# Patient Record
Sex: Female | Born: 1950 | Race: White | Marital: Married | Smoking: Former smoker
Health system: Southern US, Community
[De-identification: ages and names within clinical notes are randomized; demographics above are authoritative.]

---

## 2021-10-27 ENCOUNTER — Encounter: Payer: Self-pay | Admitting: Pulmonary Disease

## 2021-10-27 ENCOUNTER — Other Ambulatory Visit: Payer: Self-pay

## 2021-10-27 ENCOUNTER — Ambulatory Visit (INDEPENDENT_AMBULATORY_CARE_PROVIDER_SITE_OTHER): Payer: Medicare Other

## 2021-10-27 ENCOUNTER — Other Ambulatory Visit (INDEPENDENT_AMBULATORY_CARE_PROVIDER_SITE_OTHER): Payer: Medicare Other

## 2021-10-27 ENCOUNTER — Ambulatory Visit: Payer: Medicare Other | Admitting: Pulmonary Disease

## 2021-10-27 VITALS — BP 126/80 | HR 72 | Temp 98.8°F | Ht 64.5 in | Wt 205.4 lb

## 2021-10-27 DIAGNOSIS — R0600 Dyspnea, unspecified: Secondary | ICD-10-CM

## 2021-10-27 LAB — CBC WITH DIFFERENTIAL/PLATELET
Basophils Absolute: 0 10*3/uL (ref 0.0–0.1)
Basophils Relative: 0.6 % (ref 0.0–3.0)
Eosinophils Absolute: 0.1 10*3/uL (ref 0.0–0.7)
Eosinophils Relative: 1.4 % (ref 0.0–5.0)
HCT: 41.9 % (ref 36.0–46.0)
Hemoglobin: 13.7 g/dL (ref 12.0–15.0)
Lymphocytes Relative: 25.2 % (ref 12.0–46.0)
Lymphs Abs: 2.2 10*3/uL (ref 0.7–4.0)
MCHC: 32.8 g/dL (ref 30.0–36.0)
MCV: 89.8 fl (ref 78.0–100.0)
Monocytes Absolute: 0.7 10*3/uL (ref 0.1–1.0)
Monocytes Relative: 8.2 % (ref 3.0–12.0)
Neutro Abs: 5.6 10*3/uL (ref 1.4–7.7)
Neutrophils Relative %: 64.6 % (ref 43.0–77.0)
Platelets: 262 10*3/uL (ref 150.0–400.0)
RBC: 4.66 Mil/uL (ref 3.87–5.11)
RDW: 13.7 % (ref 11.5–15.5)
WBC: 8.7 10*3/uL (ref 4.0–10.5)

## 2021-10-27 MED ORDER — BREZTRI AEROSPHERE 160-9-4.8 MCG/ACT IN AERO: 2.0000 | INHALATION_SPRAY | Freq: Two times a day (BID) | RESPIRATORY_TRACT | 5 refills | Status: DC

## 2021-10-27 NOTE — Patient Instructions (Signed)
We will start you on a inhaler called Trelegy or bevespi based on insurance coverage Check CBC differential and IgE Get chest x-ray today and schedule PFTs Work on weight loss with diet and exercise Follow-up in 2 to 3 months

## 2021-10-27 NOTE — Progress Notes (Signed)
Caitlyn Hunter    [id number]    [birth year]  Primary Care Physician:No primary care provider on file.  Referring Physician: No referring provider defined for this encounter.  Chief complaint: Consult for dyspnea, asthma  HPI: 71 year old with history of asthma, allergies, hyperlipidemia, chronic headaches Referred for evaluation of dyspnea associated with cough, wheezing.  She has chest congestion with white mucus production Maintained on albuterol.  She has history of allergies to multiple allergens and was on allergy shots until 2013.  History of sinus surgery in 2000 for chronic sinusitis.  Using steroid nasal spray and antihistamine for postnasal drip and PPI for GERD  Pets: Dogs, horses, cats Occupation: Retired Metallurgist Exposures: No mold, hot tub, Jacuzzi.  No feather pillows or comforters Smoking history: 40-pack-year smoker.  Quit in 2010 Travel history: From [place].  No significant travel Relevant family history: No family history of lung disease   Outpatient Encounter Medications as of 10/27/2021  Medication Sig   albuterol (VENTOLIN HFA) 108 (90 Base) MCG/ACT inhaler SMARTSIG:1 Puff(s) Via Inhaler Every 4 Hours PRN   atorvastatin (LIPITOR) 10 MG tablet Take 10 mg by mouth daily.   escitalopram (LEXAPRO) 10 MG tablet Take 10 mg by mouth daily.   esomeprazole (NEXIUM) 40 MG capsule Take by mouth.   fluticasone (FLONASE) 50 MCG/ACT nasal spray Place into the nose.   loratadine (CLARITIN) 10 MG tablet Take by mouth.   meloxicam (MOBIC) 15 MG tablet Take 15 mg by mouth daily.   montelukast (SINGULAIR) 10 MG tablet Take 10 mg by mouth daily.   No facility-administered encounter medications on file as of 10/27/2021.    Allergies as of 10/27/2021 - Review Complete 10/27/2021  Allergen Reaction Noted   Codeine Rash 07/11/2018    No past medical history on file.  History reviewed. No pertinent surgical history.  No family history on file.  Social  History   Socioeconomic History   Marital status: Married    Spouse name: Not on file   Number of children: Not on file   Years of education: Not on file   Highest education level: Not on file  Occupational History   Not on file  Tobacco Use   Smoking status: Former    Types: Cigarettes    Quit date: 10/04/1999    Years since quitting: 22.0   Smokeless tobacco: Never  Substance and Sexual Activity   Alcohol use: Not on file   Drug use: Not on file   Sexual activity: Not on file  Other Topics Concern   Not on file  Social History Narrative   Not on file   Social Determinants of Health   Financial Resource Strain: Not on file  Food Insecurity: Not on file  Transportation Needs: Not on file  Physical Activity: Not on file  Stress: Not on file  Social Connections: Not on file  Intimate Partner Violence: Not on file    Review of systems: Review of Systems  Constitutional: Negative for fever and chills.  HENT: Negative.   Eyes: Negative for blurred vision.  Respiratory: as per HPI  Cardiovascular: Negative for chest pain and palpitations.  Gastrointestinal: Negative for vomiting, diarrhea, blood per rectum. Genitourinary: Negative for dysuria, urgency, frequency and hematuria.  Musculoskeletal: Negative for myalgias, back pain and joint pain.  Skin: Negative for itching and rash.  Neurological: Negative for dizziness, tremors, focal weakness, seizures and loss of consciousness.  Endo/Heme/Allergies: Negative for environmental allergies.  Psychiatric/Behavioral: Negative  for depression, suicidal ideas and hallucinations.  All other systems reviewed and are negative.  Physical Exam: Blood pressure 126/80, pulse 72, temperature 98.8 F (37.1 C), temperature source Oral, height 5' 4.5" (1.638 m), weight 205 lb 6.4 oz (93.2 kg), SpO2 97 %. Gen:      No acute distress HEENT:  EOMI, sclera anicteric Neck:     No masses; no thyromegaly Lungs:    Clear to auscultation  bilaterally; normal respiratory effort CV:         Regular rate and rhythm; no murmurs Abd:      + bowel sounds; soft, non-tender; no palpable masses, no distension Ext:    No edema; adequate peripheral perfusion Skin:      Warm and dry; no rash Neuro: alert and oriented x 3 Psych: normal mood and affect  Data Reviewed: Imaging:   PFTs:  Labs:  Assessment:  Evaluation for dyspnea May have asthma and COPD based on symptoms, smoking history and history of significant allergies Start breztri inhaler Check CBC differential, IgE Chest x-ray and PFTs  Work on weight loss with diet and exercise  Plan/Recommendations: Breztri CBC, IgE Chest x-ray and PFTs Weight loss  Marshell Garfinkel MD [facility] 10/27/2021, 2:18 PM  CC: No ref. provider found

## 2021-10-28 LAB — IGE: IgE (Immunoglobulin E), Serum: 76 kU/L (ref <=114)

## 2021-12-31 ENCOUNTER — Ambulatory Visit (INDEPENDENT_AMBULATORY_CARE_PROVIDER_SITE_OTHER): Payer: Medicare Other | Admitting: Pulmonary Disease

## 2021-12-31 DIAGNOSIS — R0600 Dyspnea, unspecified: Secondary | ICD-10-CM

## 2021-12-31 LAB — PULMONARY FUNCTION TEST
DL/VA % pred: 106 %
DL/VA: 4.39 ml/min/mmHg/L
DLCO cor % pred: 109 %
DLCO cor: 21.36 ml/min/mmHg
DLCO unc % pred: 109 %
DLCO unc: 21.36 ml/min/mmHg
FEF 25-75 Post: 2.88 L/sec
FEF 25-75 Pre: 1.65 L/sec
FEF2575-%Change-Post: 74 %
FEF2575-%Pred-Post: 148 %
FEF2575-%Pred-Pre: 84 %
FEV1-%Change-Post: 12 %
FEV1-%Pred-Post: 103 %
FEV1-%Pred-Pre: 92 %
FEV1-Post: 2.38 L
FEV1-Pre: 2.12 L
FEV1FVC-%Change-Post: 5 %
FEV1FVC-%Pred-Pre: 99 %
FEV6-%Change-Post: 6 %
FEV6-%Pred-Post: 102 %
FEV6-%Pred-Pre: 96 %
FEV6-Post: 2.98 L
FEV6-Pre: 2.8 L
FEV6FVC-%Pred-Post: 104 %
FEV6FVC-%Pred-Pre: 104 %
FVC-%Change-Post: 7 %
FVC-%Pred-Post: 98 %
FVC-%Pred-Pre: 92 %
FVC-Post: 3 L
FVC-Pre: 2.8 L
Post FEV1/FVC ratio: 80 %
Post FEV6/FVC ratio: 100 %
Pre FEV1/FVC ratio: 76 %
Pre FEV6/FVC Ratio: 100 %
RV % pred: 116 %
RV: 2.52 L
TLC % pred: 111 %
TLC: 5.63 L

## 2021-12-31 NOTE — Patient Instructions (Signed)
Full PFT performed today. °

## 2021-12-31 NOTE — Progress Notes (Signed)
Full PFT performed today. °

## 2022-01-19 ENCOUNTER — Ambulatory Visit: Payer: Medicare Other | Admitting: Pulmonary Disease

## 2022-01-19 ENCOUNTER — Encounter: Payer: Self-pay | Admitting: Pulmonary Disease

## 2022-01-19 VITALS — BP 126/68 | HR 66 | Temp 98.2°F | Ht 64.0 in | Wt 191.8 lb

## 2022-01-19 DIAGNOSIS — R0600 Dyspnea, unspecified: Secondary | ICD-10-CM

## 2022-01-19 NOTE — Progress Notes (Signed)
? ?      ?Caitlyn Hunter    [id number]    Aug 14, 1951 ? ?Primary Care Physician:Vasireddy, Audery Amel, MD ? ?Referring Physician: No referring provider defined for this encounter. ? ?Chief complaint: Follow-up for dyspnea, asthma ? ?HPI: ?71 year old with history of asthma, allergies, hyperlipidemia, chronic headaches ?Referred for evaluation of dyspnea associated with cough, wheezing.  She has chest congestion with white mucus production ?Maintained on albuterol. ? ?She has history of allergies to multiple allergens and was on allergy shots until 2013.  History of sinus surgery in 2000 for chronic sinusitis. ? ?Using steroid nasal spray and antihistamine for postnasal drip and PPI for GERD ? ?Pets: Dogs, horses, cats ?Occupation: Retired Metallurgist ?Exposures: No mold, hot tub, Jacuzzi.  No feather pillows or comforters ?Smoking history: 40-pack-year smoker.  Quit in 2010 ?Travel history: From [place].  No significant travel ?Relevant family history: No family history of lung disease ? ?Interim history: ?He was given breztri at last visit.  She tried it for a few weeks but did not note any change and stopped using it ?She is losing weight with a weight loss program and notes improvement in breathing ? ?Outpatient Encounter Medications as of 01/19/2022  ?Medication Sig  ? atorvastatin (LIPITOR) 10 MG tablet Take 10 mg by mouth daily.  ? escitalopram (LEXAPRO) 10 MG tablet Take 10 mg by mouth daily.  ? esomeprazole (NEXIUM) 40 MG capsule Take by mouth.  ? fluticasone (FLONASE) 50 MCG/ACT nasal spray Place into the nose.  ? loratadine (CLARITIN) 10 MG tablet Take by mouth.  ? meloxicam (MOBIC) 15 MG tablet Take 15 mg by mouth daily.  ? montelukast (SINGULAIR) 10 MG tablet Take 10 mg by mouth daily.  ? albuterol (VENTOLIN HFA) 108 (90 Base) MCG/ACT inhaler SMARTSIG:1 Puff(s) Via Inhaler Every 4 Hours PRN  ? Budeson-Glycopyrrol-Formoterol (BREZTRI AEROSPHERE) 160-9-4.8 MCG/ACT AERO Inhale 2 puffs into the lungs in the  morning and at bedtime.  ? ?No facility-administered encounter medications on file as of 01/19/2022.  ? ? ?Physical Exam: ?Blood pressure 126/80, pulse 72, temperature 98.8 ?F (37.1 ?C), temperature source Oral, height 5' 4.5" (1.638 m), weight 205 lb 6.4 oz (93.2 kg), SpO2 97 %. ?Gen:      No acute distress ?HEENT:  EOMI, sclera anicteric ?Neck:     No masses; no thyromegaly ?Lungs:    Clear to auscultation bilaterally; normal respiratory effort ?CV:         Regular rate and rhythm; no murmurs ?Abd:      + bowel sounds; soft, non-tender; no palpable masses, no distension ?Ext:    No edema; adequate peripheral perfusion ?Skin:      Warm and dry; no rash ?Neuro: alert and oriented x 3 ?Psych: normal mood and affect ? ?Data Reviewed: ?Imaging: ?Chest x-ray 10/27/2021-no active cardiopulmonary disease.  I have reviewed the images personally. ? ?PFTs: ?12/31/2021 ?FVC 3.30 [98%], FEV1 2.38 [103%], F/F 80, TLC 5.63 [101%], DLCO 21.36 [99%] ?Minimal obstructive airway disease as shown by curvature to flow loops.  Bronchodilator response. ? ?Labs: ?CBC 10/27/2021- WBC 8.7, eos 1.4%, absolute eosinophil count 122 ?IgE 10/27/2021- 76 ? ?Assessment:  ?Evaluation for dyspnea ?Has minimal reactive airway disease with bronchodilator response ?She has not noted any change with breztri inhaler and will discontinue it ? ?Overall breathing is improving as she loses weight.  There is no need for any controller medication at present ?She would like to follow-up as needed. ? ?Plan/Recommendations: ?Follow-up as needed ? ?Marshell Garfinkel MD ?[facility] ?01/19/2022, 4:08 PM ? ?CC: No ref. provider found ? ?  ?

## 2022-01-19 NOTE — Patient Instructions (Signed)
Glad you are doing well with your breathing ?Continue to lose weight ?We will discontinue the breztri ?Follow-up as needed. ?

## 2022-01-21 ENCOUNTER — Encounter: Payer: Self-pay | Admitting: Pulmonary Disease

## 2022-03-04 ENCOUNTER — Telehealth: Payer: Self-pay | Admitting: Pulmonary Disease

## 2022-03-04 DIAGNOSIS — G4733 Obstructive sleep apnea (adult) (pediatric): Secondary | ICD-10-CM

## 2022-03-04 NOTE — Telephone Encounter (Signed)
Patient states Dr. Heron Nay wanted her to have a sleep study due to her being very tired. Refers to do a HST in [place]. Please advise if patient needs a sleep consult first   ATC patient, LMTCB

## 2022-03-08 NOTE — Telephone Encounter (Signed)
Dr. Isaiah Serge, Do you want to order a sleep study for this patient per request of Dr. Heron Nay and have him follow up with one of our sleep doctors?  Please advise.  Thank you.

## 2022-03-11 NOTE — Telephone Encounter (Signed)
Called and spoke with patient. Advised patient I would order the home sleep study and someone will be in contact with her on when to come get the equipment. 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   Home sleep study order has been placed. Patient stated that she will make an appointment with Dr. Isaiah Serge when she comes to get the equipment.   Nothing further needed.

## 2022-03-11 NOTE — Telephone Encounter (Signed)
You can order a home sleep study under my name and make an appointment to see me

## 2022-04-07 ENCOUNTER — Ambulatory Visit: Payer: Medicare Other

## 2022-04-07 DIAGNOSIS — G4733 Obstructive sleep apnea (adult) (pediatric): Secondary | ICD-10-CM | POA: Diagnosis not present

## 2022-04-12 DIAGNOSIS — G4733 Obstructive sleep apnea (adult) (pediatric): Secondary | ICD-10-CM | POA: Diagnosis not present

## 2022-04-21 ENCOUNTER — Telehealth: Payer: Self-pay | Admitting: Pulmonary Disease

## 2022-04-21 DIAGNOSIS — G4733 Obstructive sleep apnea (adult) (pediatric): Secondary | ICD-10-CM

## 2022-04-21 NOTE — Telephone Encounter (Signed)
Home sleep study 04/06/2022 Moderate sleep apnea with AHI 17.2, desats to 83%  Please let patient know that sleep study does confirm moderate sleep apnea Order CPAP 5-15 cm H20 and follow up with one of our sleep doctors.

## 2022-04-21 NOTE — Telephone Encounter (Signed)
Called and spoke with patient. She is requesting the results of her HST test on 07/06. Dr. Isaiah Serge, can you please advise? Thanks!

## 2022-04-22 NOTE — Telephone Encounter (Signed)
Called and spoke with patient. Patient verbalized understanding. Order has been placed for cpap.   Nothing further needed.

## 2022-05-19 ENCOUNTER — Telehealth: Payer: Self-pay | Admitting: Pulmonary Disease

## 2022-05-19 NOTE — Telephone Encounter (Signed)
Can hold using CPAP mask for now let skin irritation resolved.  Patient was supposed to follow-up with sleep provider per Dr. Shirlee More last note.  They do make CPAP liners that she can use underneath the mass to see if this will help.  But we can discuss this at the office visit Please set her up with a follow-up visit with sleep MD /APP to go over issues.   Please contact office for sooner follow up if symptoms do not improve or worsen or seek emergency care

## 2022-05-19 NOTE — Telephone Encounter (Signed)
Spoke with pt and scheduled her for sleep consult with Rhunette Croft on 02/24/22 where masks will dicussed. Nothing further needed at this time.

## 2022-05-19 NOTE — Telephone Encounter (Signed)
Primary Pulmonologist: PM  Last office visit and with whom: April 2023, PM What do we see them for (pulmonary problems): OSA Last OV assessment/plan:  Assessment:  Evaluation for dyspnea Has minimal reactive airway disease with bronchodilator response She has not noted any change with breztri inhaler and will discontinue it   Overall breathing is improving as she loses weight.  There is no need for any controller medication at present She would like to follow-up as needed.   Plan/Recommendations: Follow-up as needed  Was appointment offered to patient (explain)?  No appointment in office available this week    Reason for call: Pt states when she woke up this morning and removed C-Pap mask skin on face under where mask had been was red and burned and was still doing so at time of call. Pt had contacted DME who stated they did have another type of mask but it also had latex in it. Pt states she has the same reaction to bandages. Pt advise not use mask until situation was addressed. Tammy please advise as Dr. Isaiah Serge is unavailable. Thank you    (examples of things to ask: : When did symptoms start? Fever? Cough? Productive? Color to sputum? More sputum than usual? Wheezing? Have you needed increased oxygen? Are you taking your respiratory medications? What over the counter measures have you tried?)  Allergies  Allergen Reactions   Codeine Rash    Immunization History  Administered Date(s) Administered   Fluad Quad(high Dose 65+) 09/02/2021

## 2022-05-27 ENCOUNTER — Ambulatory Visit: Payer: Medicare Other | Admitting: Nurse Practitioner

## 2022-05-27 ENCOUNTER — Encounter: Payer: Self-pay | Admitting: Nurse Practitioner

## 2022-05-27 DIAGNOSIS — G4733 Obstructive sleep apnea (adult) (pediatric): Secondary | ICD-10-CM | POA: Diagnosis not present

## 2022-05-27 DIAGNOSIS — R0609 Other forms of dyspnea: Secondary | ICD-10-CM | POA: Diagnosis not present

## 2022-05-27 DIAGNOSIS — Z9989 Dependence on other enabling machines and devices: Secondary | ICD-10-CM

## 2022-05-27 NOTE — Patient Instructions (Addendum)
Continue to use CPAP every night, minimum of 4-6 hours a night.  Change equipment every 30 days or as directed by DME. Wash your tubing with warm soap and water daily, hang to dry. Wash humidifier portion weekly.  Be aware of reduced alertness and do not drive or operate heavy machinery if experiencing this or drowsiness.  Exercise encouraged, as tolerated. Avoid or decrease alcohol consumption and medications that make you more sleepy, if possible. Notify if persistent daytime sleepiness occurs even with consistent use of CPAP.   Continue singulair 10 mg At bedtime  Continue claritin 1 tab daily for allergies  Continue flonase 2 sprays each nostril daily   Follow up in 6 weeks with Katie Yazmine Sorey,NP. If symptoms do not improve or worsen, please contact office for sooner follow up or seek emergency care.

## 2022-05-27 NOTE — Progress Notes (Signed)
@Patient  ID: , female    DOB: 26-Jan-1951, 71 y.o.   MRN: [id number]  Chief Complaint  Patient presents with   Consult    Not able to tolerate CPAP mask. Some SOB with hills, exertion    Referring provider: [id number], MD  HPI: 71 year old female, former smoker followed for asthma and DOE. She is a patient of Dr. 78 and last seen in office 01/19/2022. She has minimal reactive airway disease with bronchodilator response; didn't have perceived benefit from [place]. Past medical history significant for allergies, HLD, chronic headaches.  TEST/EVENTS:  12/31/2021 PFT: FVC 98, FEV1 103, ratio 80, TLC 101, DLCO 99. +12% BD change 04/06/2022 HST: AHI 17.2, SpO2 low 83%   01/19/2022: OV with Dr. 01/21/2022. She was initially referred in January for evaluation of DOE and cough. PFTs showed normal lung function; minimal obstruction on flow volume loops and positive BD. History of asthma; tried on Breztri but no perceived benefit. Breathing improved with weight loss. Follow up PRN. Later, Dr. February contacted requesting sleep study for patient due to fatigue - HST ordered. Advised follow up with NP or sleep MD.  05/27/2022: Today - follow up Patient presents today for follow up after home sleep study which revealed moderate OSA and starting on CPAP therapy. She was doing well with CPAP initially and felt like she was sleeping better at night with it. Unfortunately, she developed irritation from the mask so she had to stop using it. She would really like to get back on CPAP, if she can tolerate it. She denies drowsy driving or morning headaches. She still has daily fatigue symptoms.  She goes to bed around 9pm. Takes her 1-2 hours to fall asleep. She has not tried anything for sleep assistance but is considering trying melatonin. She wakes frequently throughout the night; although, when she was on CPAP, she only had 1-2 awakenings. She officially gets out of the bed in the morning  around 7am. She's retired. Lives with her husband. No cardiac history, aside from HLD. No hx of stroke. She has been actively working on weight loss measures and is down almost 20 pounds since she was first seen here in January. She quit smoking 10 years ago; doesn't drink alcohol.  Her breathing is stable. She only ever gets winded if she's running uphill she said. Feels like her breathing continues to improve with her weight loss. Hasn't had to use albuterol.   [place] 4  Allergies  Allergen Reactions   Codeine Rash    Immunization History  Administered Date(s) Administered   Fluad Quad(high Dose 65+) 09/02/2021    History reviewed. No pertinent past medical history.  Tobacco History: Social History   Tobacco Use  Smoking Status Former   Types: Cigarettes   Quit date: 10/04/1999   Years since quitting: 22.6  Smokeless Tobacco Never   Counseling given: Not Answered   Outpatient Medications Prior to Visit  Medication Sig Dispense Refill   ascorbic acid (VITAMIN C) 1000 MG tablet Take 1,000 mg by mouth.     atorvastatin (LIPITOR) 10 MG tablet Take 10 mg by mouth daily.     Cholecalciferol 125 MCG (5000 UT) TABS Take by mouth.     escitalopram (LEXAPRO) 10 MG tablet Take 10 mg by mouth daily.     esomeprazole (NEXIUM) 40 MG capsule Take by mouth.     levocetirizine (XYZAL) 5 MG tablet Take 5 mg by mouth daily.     meloxicam (MOBIC) 15 MG  tablet Take 15 mg by mouth daily.     montelukast (SINGULAIR) 10 MG tablet Take 10 mg by mouth daily.     albuterol (VENTOLIN HFA) 108 (90 Base) MCG/ACT inhaler SMARTSIG:1 Puff(s) Via Inhaler Every 4 Hours PRN     Budeson-Glycopyrrol-Formoterol (BREZTRI AEROSPHERE) 160-9-4.8 MCG/ACT AERO Inhale 2 puffs into the lungs in the morning and at bedtime. 10.7 g 5   fluticasone (FLONASE) 50 MCG/ACT nasal spray Place into the nose.     loratadine (CLARITIN) 10 MG tablet Take by mouth.     No facility-administered medications prior to visit.      Review of Systems:   Constitutional: No weight loss or gain, night sweats, fevers, chills, or lassitude. +fatigue HEENT: No headaches, difficulty swallowing, tooth/dental problems, or sore throat. No sneezing, itching, ear ache, nasal congestion, or post nasal drip CV:  No chest pain, orthopnea, PND, swelling in lower extremities, anasarca, dizziness, palpitations, syncope Resp: No shortness of breath with exertion or at rest. No excess mucus or change in color of mucus. No productive or non-productive. No hemoptysis. No wheezing.  No chest wall deformity GI:  No heartburn, indigestion, abdominal pain, nausea, vomiting, diarrhea, change in bowel habits, loss of appetite, bloody stools.  GU: No dysuria, change in color of urine, urgency or frequency.  No flank pain, no hematuria  Skin: No rash, lesions, ulcerations MSK:  No joint pain or swelling.  No decreased range of motion.  No back pain. Neuro: No dizziness or lightheadedness.  Psych: No depression or anxiety. Mood stable.     Physical Exam:  BP 124/72 (BP Location: Right Arm, Patient Position: Sitting)   Pulse (!) 59   Temp 98.5 F (36.9 C)   Ht 5\' 4"  (1.626 m)   Wt 187 lb 12.8 oz (85.2 kg)   SpO2 100% Comment: RA  BMI 32.24 kg/m   GEN: Pleasant, interactive, well-appearing; in no acute distress. HEENT:  Normocephalic and atraumatic. PERRLA. Sclera white. Nasal turbinates pink, moist and patent bilaterally. No rhinorrhea present. Oropharynx pink and moist, without exudate or edema. No lesions, ulcerations, or postnasal drip.  NECK:  Supple w/ fair ROM. No JVD present. Normal carotid impulses w/o bruits. Thyroid symmetrical with no goiter or nodules palpated. No lymphadenopathy.   CV: RRR, no m/r/g, no peripheral edema. Pulses intact, +2 bilaterally. No cyanosis, pallor or clubbing. PULMONARY:  Unlabored, regular breathing. Clear bilaterally A&P w/o wheezes/rales/rhonchi. No accessory muscle use. No dullness to  percussion. GI: BS present and normoactive. Soft, non-tender to palpation. No organomegaly or masses detected. No CVA tenderness. MSK: No erythema, warmth or tenderness. Cap refil <2 sec all extrem. No deformities or joint swelling noted.  Neuro: A/Ox3. No focal deficits noted.   Skin: Warm, no lesions or rashe Psych: Normal affect and behavior. Judgement and thought content appropriate.     Lab Results:  CBC    Component Value Date/Time   WBC 8.7 10/27/2021 1516   RBC 4.66 10/27/2021 1516   HGB 13.7 10/27/2021 1516   HCT 41.9 10/27/2021 1516   PLT 262.0 10/27/2021 1516   MCV 89.8 10/27/2021 1516   MCHC 32.8 10/27/2021 1516   RDW 13.7 10/27/2021 1516   LYMPHSABS 2.2 10/27/2021 1516   MONOABS 0.7 10/27/2021 1516   EOSABS 0.1 10/27/2021 1516   BASOSABS 0.0 10/27/2021 1516    BMET No results found for: "NA", "K", "CL", "CO2", "GLUCOSE", "BUN", "CREATININE", "CALCIUM", "GFRNONAA", "GFRAA"  BNP No results found for: "BNP"   Imaging:  No results  found.       Latest Ref Rng & Units 12/31/2021    3:36 PM  PFT Results  FVC-Pre L 2.80   FVC-Predicted Pre % 92   FVC-Post L 3.00   FVC-Predicted Post % 98   Pre FEV1/FVC % % 76   Post FEV1/FCV % % 80   FEV1-Pre L 2.12   FEV1-Predicted Pre % 92   FEV1-Post L 2.38   DLCO uncorrected ml/min/mmHg 21.36   DLCO UNC% % 109   DLCO corrected ml/min/mmHg 21.36   DLCO COR %Predicted % 109   DLVA Predicted % 106   TLC L 5.63   TLC % Predicted % 111   RV % Predicted % 116     No results found for: "NITRICOXIDE"      Assessment & Plan:   OSA on CPAP Moderate OSA with AHI 17.2, SpO2 low 83% on previous HST. She was started on CPAP therapy last week; developed significant irritation on her face from the mask. She is wanting to get started back on CPAP as she had good benefit from it the few nights she used it. Recommended she try a mask liner to prevent skin irritation. She is going to call her DME company and if they do not  have these, she will check online. If she still has trouble, we can try alternative mask. Encouraged her to continue using CPAP nightly. We discussed how untreated sleep apnea puts an individual at risk for cardiac arrhthymias, pulm HTN, DM, stroke and increases their risk for daytime accidents. We also briefly reviewed treatment options including weight loss, side sleeping position, oral appliance, CPAP therapy or referral to ENT for possible surgical options. Cautioned to avoid drowsy driving.  Patient Instructions  Continue to use CPAP every night, minimum of 4-6 hours a night.  Change equipment every 30 days or as directed by DME. Wash your tubing with warm soap and water daily, hang to dry. Wash humidifier portion weekly.  Be aware of reduced alertness and do not drive or operate heavy machinery if experiencing this or drowsiness.  Exercise encouraged, as tolerated. Avoid or decrease alcohol consumption and medications that make you more sleepy, if possible. Notify if persistent daytime sleepiness occurs even with consistent use of CPAP.   Continue singulair 10 mg At bedtime  Continue claritin 1 tab daily for allergies  Continue flonase 2 sprays each nostril daily   Follow up in 6 weeks with Katie Clyde Zarrella,NP. If symptoms do not improve or worsen, please contact office for sooner follow up or seek emergency care.     DOE (dyspnea on exertion) DOE has significantly improved with her intentional weight loss. No concerns or complaints surrounding her breathing today. She is to follow up with Dr. Vaughan Browner as needed.    I spent 32 minutes of dedicated to the care of this patient on the date of this encounter to include pre-visit review of records, face-to-face time with the patient discussing conditions above, post visit ordering of testing, clinical documentation with the electronic health record, making appropriate referrals as documented, and communicating necessary findings to members of the  patients care team.  Clayton Bibles, NP 05/27/2022  Pt aware and understands NP's role.

## 2022-05-27 NOTE — Assessment & Plan Note (Addendum)
Moderate OSA with AHI 17.2, SpO2 low 83% on previous HST. She was started on CPAP therapy last week; developed significant irritation on her face from the mask. She is wanting to get started back on CPAP as she had good benefit from it the few nights she used it. Recommended she try a mask liner to prevent skin irritation. She is going to call her DME company and if they do not have these, she will check online. If she still has trouble, we can try alternative mask. Encouraged her to continue using CPAP nightly. We discussed how untreated sleep apnea puts an individual at risk for cardiac arrhthymias, pulm HTN, DM, stroke and increases their risk for daytime accidents. We also briefly reviewed treatment options including weight loss, side sleeping position, oral appliance, CPAP therapy or referral to ENT for possible surgical options. Cautioned to avoid drowsy driving.  Patient Instructions  Continue to use CPAP every night, minimum of 4-6 hours a night.  Change equipment every 30 days or as directed by DME. Wash your tubing with warm soap and water daily, hang to dry. Wash humidifier portion weekly.  Be aware of reduced alertness and do not drive or operate heavy machinery if experiencing this or drowsiness.  Exercise encouraged, as tolerated. Avoid or decrease alcohol consumption and medications that make you more sleepy, if possible. Notify if persistent daytime sleepiness occurs even with consistent use of CPAP.   Continue singulair 10 mg At bedtime  Continue claritin 1 tab daily for allergies  Continue flonase 2 sprays each nostril daily   Follow up in 6 weeks with Katie Ondre Salvetti,NP. If symptoms do not improve or worsen, please contact office for sooner follow up or seek emergency care.

## 2022-05-27 NOTE — Assessment & Plan Note (Addendum)
Caitlyn Hunter has significantly improved with her intentional weight loss. No concerns or complaints surrounding her breathing today. She is to follow up with Dr. Isaiah Serge as needed.

## 2022-05-28 NOTE — Progress Notes (Signed)
Reviewed and agree with assessment/plan.   Illiana Losurdo, MD [facility] 05/28/2022, 2:32 PM Pager:  [phone]  

## 2022-07-08 ENCOUNTER — Encounter: Payer: Self-pay | Admitting: Nurse Practitioner

## 2022-07-08 ENCOUNTER — Ambulatory Visit: Payer: Medicare Other | Admitting: Nurse Practitioner

## 2022-07-08 VITALS — BP 128/80 | HR 54 | Temp 98.2°F | Ht 64.0 in | Wt 193.4 lb

## 2022-07-08 DIAGNOSIS — Z23 Encounter for immunization: Secondary | ICD-10-CM | POA: Diagnosis not present

## 2022-07-08 DIAGNOSIS — G4733 Obstructive sleep apnea (adult) (pediatric): Secondary | ICD-10-CM | POA: Diagnosis not present

## 2022-07-08 NOTE — Progress Notes (Signed)
@Patient  ID: , female    DOB: 10-Jun-1951, 71 y.o.   MRN: [id number]  Chief Complaint  Patient presents with   Follow-up    Referring provider: [id number], MD  HPI: 71 year old female, former smoker followed for asthma and DOE. She is a patient of Dr. 78 and last seen in office 05/27/2022 by [facility] NP. She has minimal reactive airway disease with bronchodilator response; didn't have perceived benefit from [place]. Past medical history significant for allergies, HLD, chronic headaches.  TEST/EVENTS:  12/31/2021 PFT: FVC 98, FEV1 103, ratio 80, TLC 101, DLCO 99. +12% BD change 04/06/2022 HST: AHI 17.2, SpO2 low 83%   01/19/2022: OV with Dr. 01/21/2022. She was initially referred in January for evaluation of DOE and cough. PFTs showed normal lung function; minimal obstruction on flow volume loops and positive BD. History of asthma; tried on Breztri but no perceived benefit. Breathing improved with weight loss. Follow up PRN. Later, Dr. February contacted requesting sleep study for patient due to fatigue - HST ordered. Advised follow up with NP or sleep MD.  05/27/2022: 05/29/2022 with Trystan Eads NP for follow up after home sleep study which revealed moderate OSA and starting on CPAP therapy. She was doing well with CPAP initially and felt like she was sleeping better at night with it. Unfortunately, she developed irritation from the mask so she had to stop using it. She would really like to get back on CPAP, if she can tolerate it. She denies drowsy driving or morning headaches. She still has daily fatigue symptoms.   07/08/2022: Today - follow up Patient presents today for follow up of her OSA. She has been doing well with her CPAP. Feels like she's sleeping much better with it and hasn't been waking up during the night. She feels like the new mask has helped and she's no longer breaking out. She still has some daytime sleepiness. She denies any snoring, drowsy driving, morning headaches. She  does feel like her face is puffy in the morning; may be pulling her mask too tight. Breathing has been stable.   06/07/2022-07/06/2022 CPAP auto 5-15 cmH2O 30/30 days; 97% >4 hr; average use 8 hr 35 min Pressure median 7.4, 95th 11.3 Leaks median 0, 95th 1.6 AHI 0.9  Allergies  Allergen Reactions   Codeine Rash    Immunization History  Administered Date(s) Administered   Fluad Quad(high Dose 65+) 09/02/2021, 07/08/2022    No past medical history on file.  Tobacco History: Social History   Tobacco Use  Smoking Status Former   Types: Cigarettes   Quit date: 10/04/1999   Years since quitting: 22.7  Smokeless Tobacco Never   Counseling given: Not Answered   Outpatient Medications Prior to Visit  Medication Sig Dispense Refill   ascorbic acid (VITAMIN C) 1000 MG tablet Take 1,000 mg by mouth.     atorvastatin (LIPITOR) 10 MG tablet Take 10 mg by mouth daily.     Cholecalciferol 125 MCG (5000 UT) TABS Take by mouth.     escitalopram (LEXAPRO) 10 MG tablet Take 10 mg by mouth daily.     esomeprazole (NEXIUM) 40 MG capsule Take by mouth.     fluticasone (FLONASE) 50 MCG/ACT nasal spray Place 2 sprays into both nostrils daily.     levocetirizine (XYZAL) 5 MG tablet Take 5 mg by mouth daily.     meloxicam (MOBIC) 15 MG tablet Take 15 mg by mouth daily.     montelukast (SINGULAIR) 10 MG tablet Take  10 mg by mouth daily.     albuterol (VENTOLIN HFA) 108 (90 Base) MCG/ACT inhaler SMARTSIG:1 Puff(s) Via Inhaler Every 4 Hours PRN     No facility-administered medications prior to visit.     Review of Systems:   Constitutional: No weight loss or gain, night sweats, fevers, chills, or lassitude. +fatigue HEENT: No headaches, difficulty swallowing, tooth/dental problems, or sore throat. No sneezing, itching, ear ache, nasal congestion, or post nasal drip CV:  No chest pain, orthopnea, PND, swelling in lower extremities, anasarca, dizziness, palpitations, syncope Resp: No shortness of  breath with exertion or at rest. No excess mucus or change in color of mucus. No productive or non-productive. No hemoptysis. No wheezing.  No chest wall deformity GI:  No heartburn, indigestion, abdominal pain, nausea, vomiting, diarrhea, change in bowel habits, loss of appetite, bloody stools.  Neuro: No dizziness or lightheadedness.  Psych: No depression or anxiety. Mood stable.     Physical Exam:  BP 128/80 (BP Location: Right Arm, Cuff Size: Normal)   Pulse (!) 54   Temp 98.2 F (36.8 C)   Ht 5\' 4"  (1.626 m)   Wt 193 lb 6.4 oz (87.7 kg)   SpO2 100%   BMI 33.20 kg/m   GEN: Pleasant, interactive, well-appearing; in no acute distress. HEENT:  Normocephalic and atraumatic. PERRLA. Sclera white. Nasal turbinates pink, moist and patent bilaterally. No rhinorrhea present. Oropharynx pink and moist, without exudate or edema. No lesions, ulcerations, or postnasal drip.  NECK:  Supple w/ fair ROM. No JVD present. Normal carotid impulses w/o bruits. Thyroid symmetrical with no goiter or nodules palpated. No lymphadenopathy.   CV: RRR, no m/r/g, no peripheral edema. Pulses intact, +2 bilaterally. No cyanosis, pallor or clubbing. PULMONARY:  Unlabored, regular breathing. Clear bilaterally A&P w/o wheezes/rales/rhonchi. No accessory muscle use. No dullness to percussion. GI: BS present and normoactive. Soft, non-tender to palpation. No organomegaly or masses detected.  MSK: No erythema, warmth or tenderness. No deformities or joint swelling noted.  Neuro: A/Ox3. No focal deficits noted.   Skin: Warm, no lesions or rashe Psych: Normal affect and behavior. Judgement and thought content appropriate.     Lab Results:  CBC    Component Value Date/Time   WBC 8.7 10/27/2021 1516   RBC 4.66 10/27/2021 1516   HGB 13.7 10/27/2021 1516   HCT 41.9 10/27/2021 1516   PLT 262.0 10/27/2021 1516   MCV 89.8 10/27/2021 1516   MCHC 32.8 10/27/2021 1516   RDW 13.7 10/27/2021 1516   LYMPHSABS 2.2  10/27/2021 1516   MONOABS 0.7 10/27/2021 1516   EOSABS 0.1 10/27/2021 1516   BASOSABS 0.0 10/27/2021 1516    BMET No results found for: "NA", "K", "CL", "CO2", "GLUCOSE", "BUN", "CREATININE", "CALCIUM", "GFRNONAA", "GFRAA"  BNP No results found for: "BNP"   Imaging:  No results found.       Latest Ref Rng & Units 12/31/2021    3:36 PM  PFT Results  FVC-Pre L 2.80   FVC-Predicted Pre % 92   FVC-Post L 3.00   FVC-Predicted Post % 98   Pre FEV1/FVC % % 76   Post FEV1/FCV % % 80   FEV1-Pre L 2.12   FEV1-Predicted Pre % 92   FEV1-Post L 2.38   DLCO uncorrected ml/min/mmHg 21.36   DLCO UNC% % 109   DLCO corrected ml/min/mmHg 21.36   DLCO COR %Predicted % 109   DLVA Predicted % 106   TLC L 5.63   TLC % Predicted % 111  RV % Predicted % 116     No results found for: "NITRICOXIDE"      Assessment & Plan:   OSA on CPAP Excellent compliance and receives good benefit from use. She has good control of her OSA with residual AHI 0.9. She feels like her mask is pulled too tight; advised she could try adjusting it but to monitor for increased leaks. Currently, she has no significant leakage. She's still having some daytime fatigue; recommended we monitor her over the next 8 weeks and if she's still having persistent symptoms despite control of her OSA for 3 months, then we can look at pharmacological therapy. She was agreeable to this plan. Cautioned to be aware of drowsy driving.   Patient Instructions  Continue to use CPAP every night, minimum of 4-6 hours a night.  Change equipment every 30 days or as directed by DME. Wash your tubing with warm soap and water daily, hang to dry. Wash humidifier portion weekly.  Be aware of reduced alertness and do not drive or operate heavy machinery if experiencing this or drowsiness.  Exercise encouraged, as tolerated. Avoid or decrease alcohol consumption and medications that make you more sleepy, if possible. Notify if persistent  daytime sleepiness occurs even with consistent use of CPAP.    Continue singulair 10 mg At bedtime  Continue claritin 1 tab daily for allergies  Continue flonase 2 sprays each nostril daily    Follow up in 8 weeks with Dr. Vaughan Browner or Katie Solon Alban,NP. If symptoms do not improve or worsen, please contact office for sooner follow up or seek emergency care.      I spent 28 minutes of dedicated to the care of this patient on the date of this encounter to include pre-visit review of records, face-to-face time with the patient discussing conditions above, post visit ordering of testing, clinical documentation with the electronic health record, making appropriate referrals as documented, and communicating necessary findings to members of the patients care team.  Clayton Bibles, NP 07/08/2022  Pt aware and understands NP's role.

## 2022-07-08 NOTE — Assessment & Plan Note (Signed)
Excellent compliance and receives good benefit from use. She has good control of her OSA with residual AHI 0.9. She feels like her mask is pulled too tight; advised she could try adjusting it but to monitor for increased leaks. Currently, she has no significant leakage. She's still having some daytime fatigue; recommended we monitor her over the next 8 weeks and if she's still having persistent symptoms despite control of her OSA for 3 months, then we can look at pharmacological therapy. She was agreeable to this plan. Cautioned to be aware of drowsy driving.   Patient Instructions  Continue to use CPAP every night, minimum of 4-6 hours a night.  Change equipment every 30 days or as directed by DME. Wash your tubing with warm soap and water daily, hang to dry. Wash humidifier portion weekly.  Be aware of reduced alertness and do not drive or operate heavy machinery if experiencing this or drowsiness.  Exercise encouraged, as tolerated. Avoid or decrease alcohol consumption and medications that make you more sleepy, if possible. Notify if persistent daytime sleepiness occurs even with consistent use of CPAP.    Continue singulair 10 mg At bedtime  Continue claritin 1 tab daily for allergies  Continue flonase 2 sprays each nostril daily    Follow up in 8 weeks with Dr. Vaughan Browner or Katie Kristi Hyer,NP. If symptoms do not improve or worsen, please contact office for sooner follow up or seek emergency care.

## 2022-07-08 NOTE — Patient Instructions (Addendum)
Continue to use CPAP every night, minimum of 4-6 hours a night.  Change equipment every 30 days or as directed by DME. Wash your tubing with warm soap and water daily, hang to dry. Wash humidifier portion weekly.  Be aware of reduced alertness and do not drive or operate heavy machinery if experiencing this or drowsiness.  Exercise encouraged, as tolerated. Avoid or decrease alcohol consumption and medications that make you more sleepy, if possible. Notify if persistent daytime sleepiness occurs even with consistent use of CPAP.    Continue singulair 10 mg At bedtime  Continue claritin 1 tab daily for allergies  Continue flonase 2 sprays each nostril daily    Follow up in 8 weeks with Dr. Vaughan Browner or Katie Jamecia Lerman,NP. If symptoms do not improve or worsen, please contact office for sooner follow up or seek emergency care.

## 2022-09-05 ENCOUNTER — Ambulatory Visit: Payer: Medicare Other | Admitting: Nurse Practitioner

## 2023-03-28 IMAGING — DX DG CHEST 2V
2 series · 2 of 2 positions shown · non-contrast
Comparison: None.

CLINICAL DATA: Asthma short of breath

EXAM:
CHEST - 2 VIEW

[chest pa]
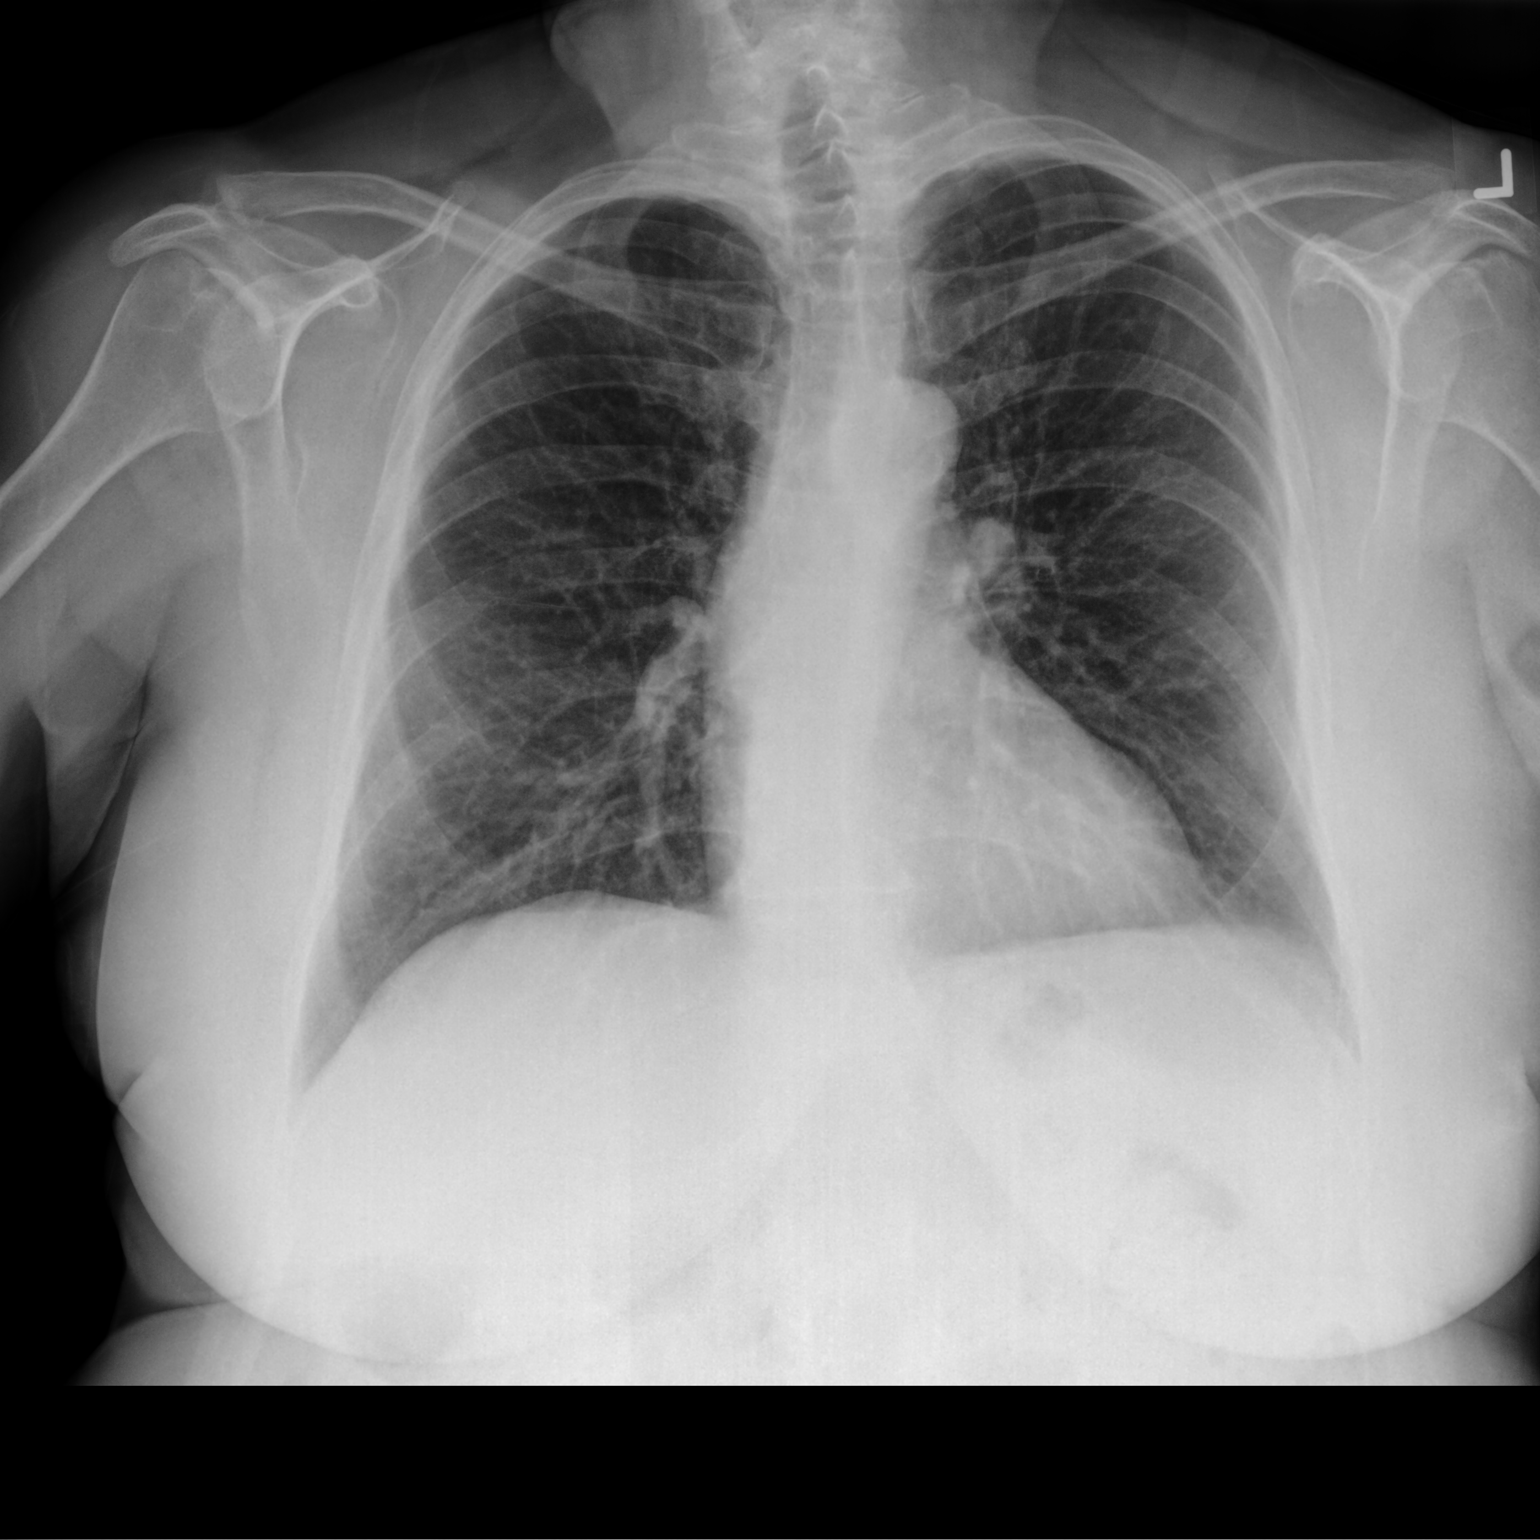

[chest lat]
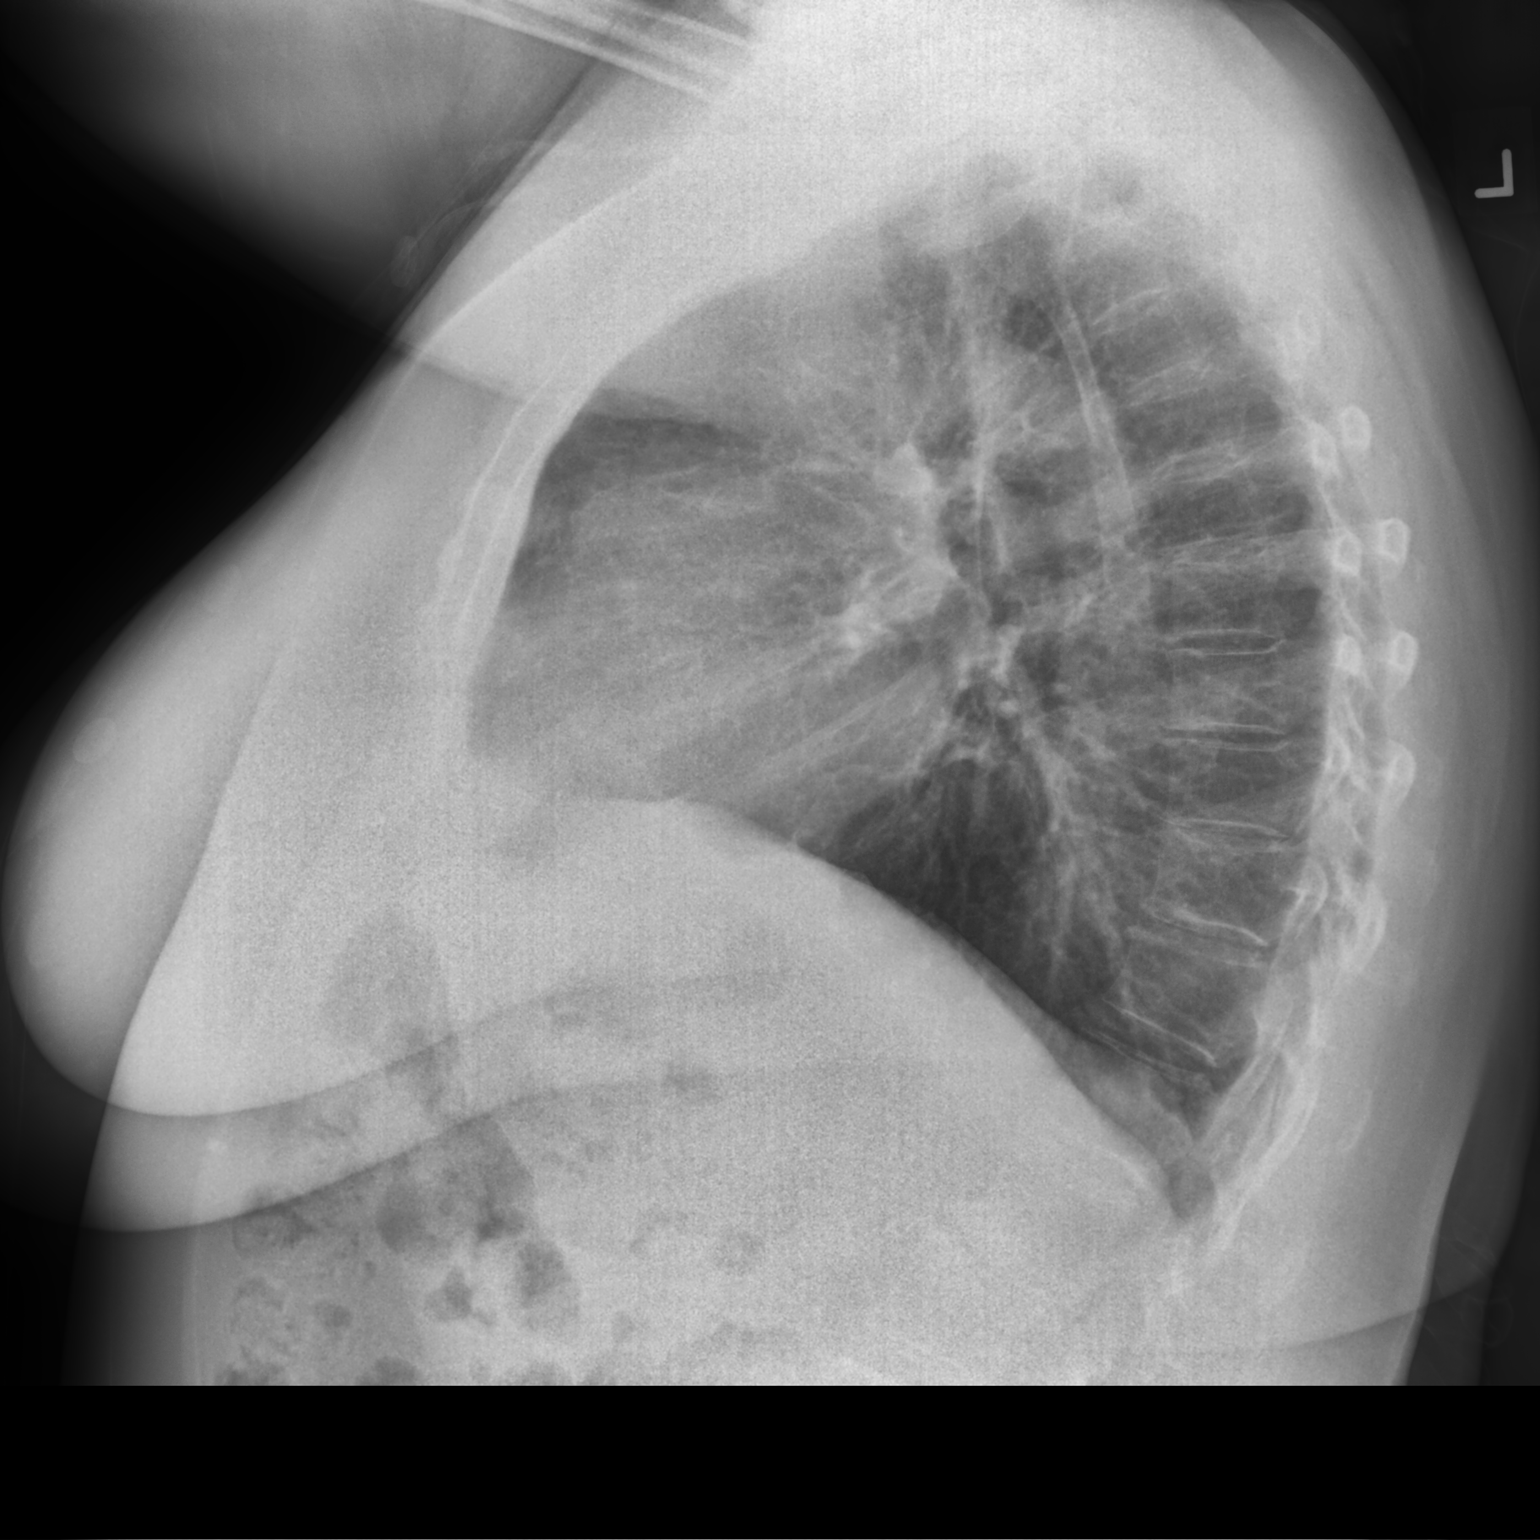

[2 of 2 positions shown; findings below may reference images not displayed]

FINDINGS: The heart size and mediastinal contours are within normal limits.
Both lungs are clear. The visualized skeletal structures are
unremarkable.
IMPRESSION: No active cardiopulmonary disease.
